# Patient Record
Sex: Male | Born: 1970 | Race: White | Hispanic: No | Marital: Married | Smoking: Never smoker
Health system: Southern US, Community
[De-identification: ages and names within clinical notes are randomized; demographics above are authoritative.]

## PROBLEM LIST (undated history)

## (undated) DIAGNOSIS — R079 Chest pain, unspecified: Secondary | ICD-10-CM

## (undated) DIAGNOSIS — N200 Calculus of kidney: Secondary | ICD-10-CM

## (undated) HISTORY — DX: Chest pain, unspecified: R07.9

---

## 2002-03-14 ENCOUNTER — Emergency Department (HOSPITAL_COMMUNITY): Admission: EM | Admit: 2002-03-14 | Discharge: 2002-03-14 | Payer: Self-pay | Admitting: Emergency Medicine

## 2006-11-07 ENCOUNTER — Encounter: Admission: RE | Admit: 2006-11-07 | Discharge: 2006-11-07 | Payer: Self-pay | Admitting: Internal Medicine

## 2008-06-01 IMAGING — US US RENAL
1 series · 14 of 25 positions shown · non-contrast
Comparison: None.

CLINICAL DATA: 35-year-old with hematuria. 
 RENAL/URINARY TRACT ULTRASOUND:
TECHNIQUE: Complete ultrasound examination of the urinary tract was performed including evaluation of the kidneys, renal collecting systems, and urinary bladder.

[Series 1: unknown · 0.23mm/px · 14 of 34 slices shown]
[im 1/34]
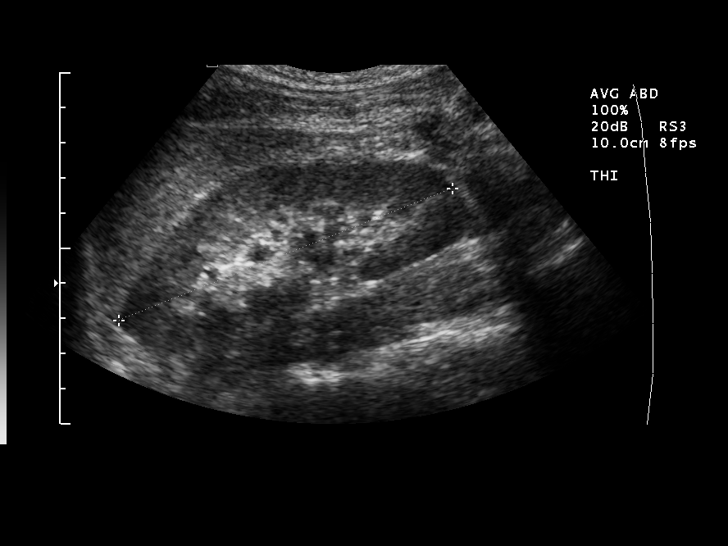
[im 3/34]
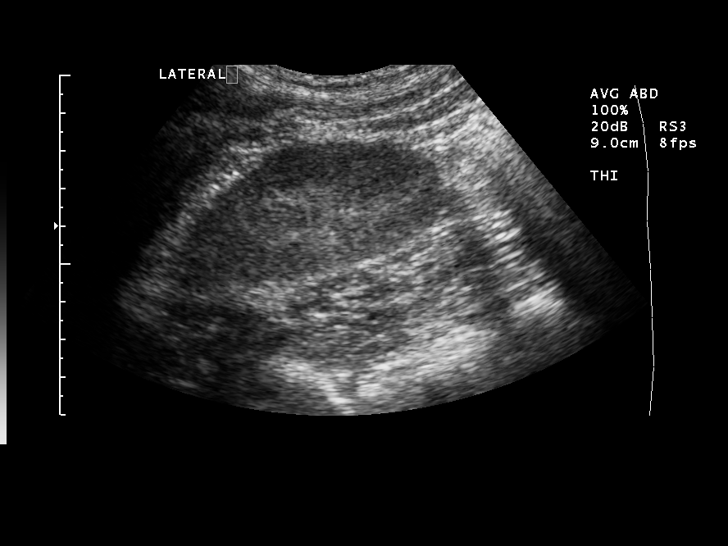
[im 6/34]
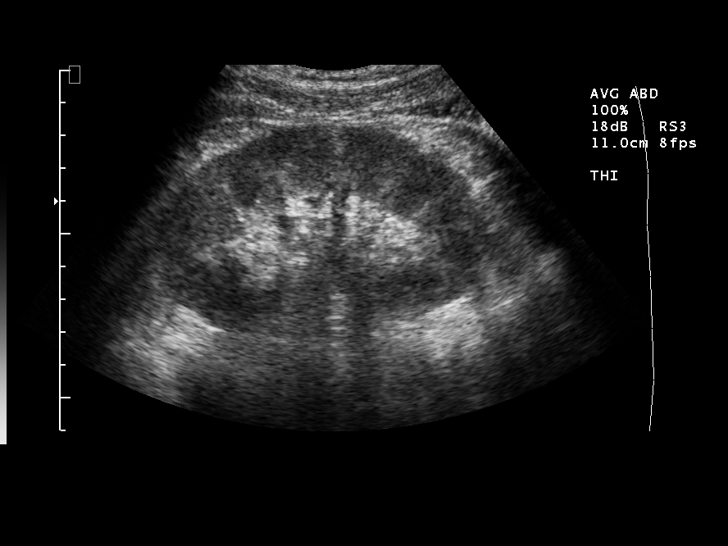
[im 9/34]
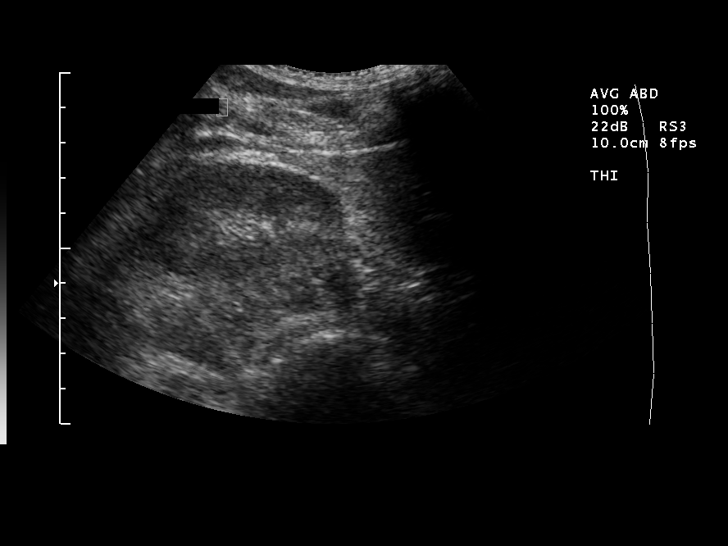
[im 12/34]
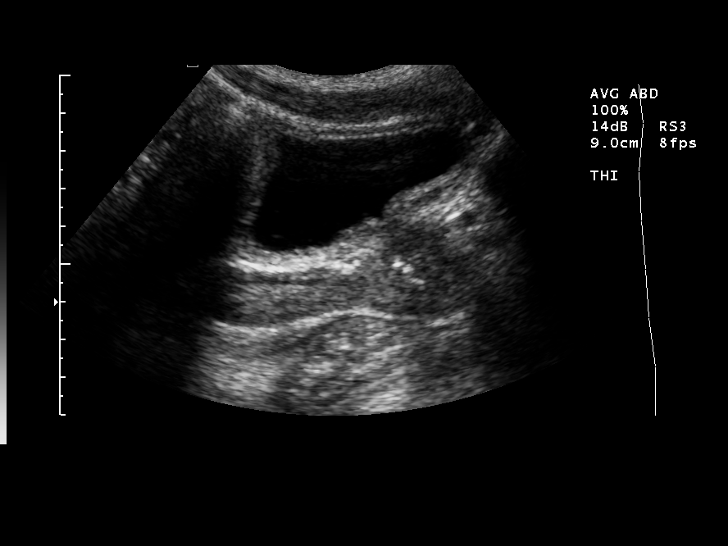
[im 13/34]
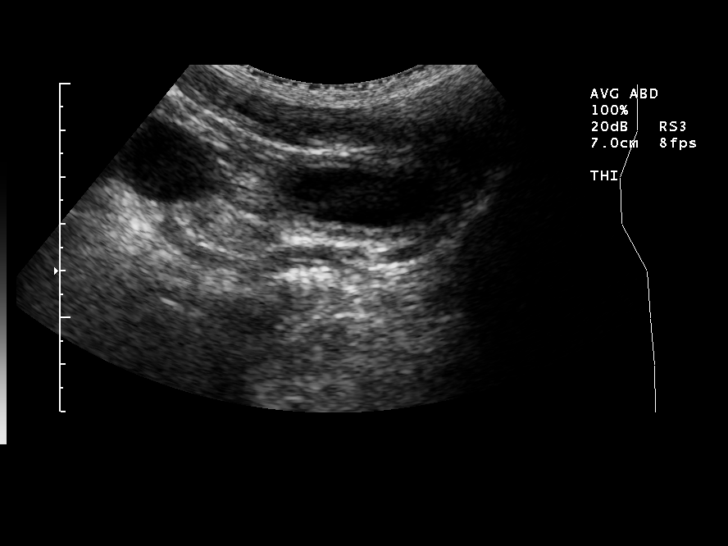
[im 16/34]
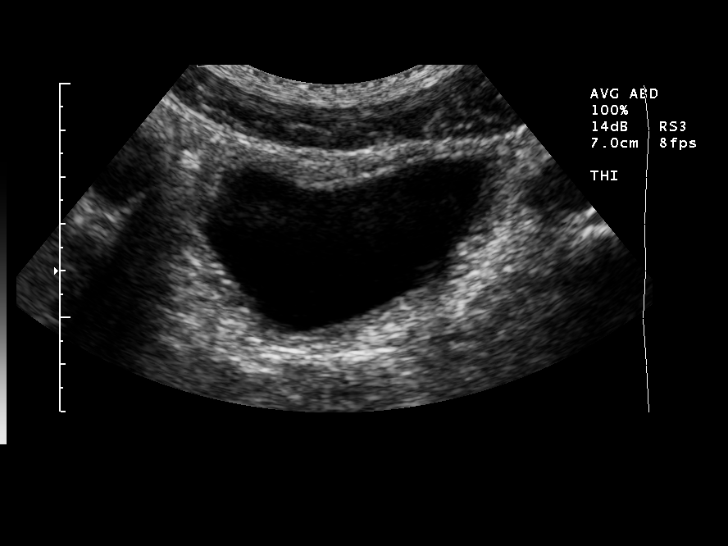
[im 18/34]
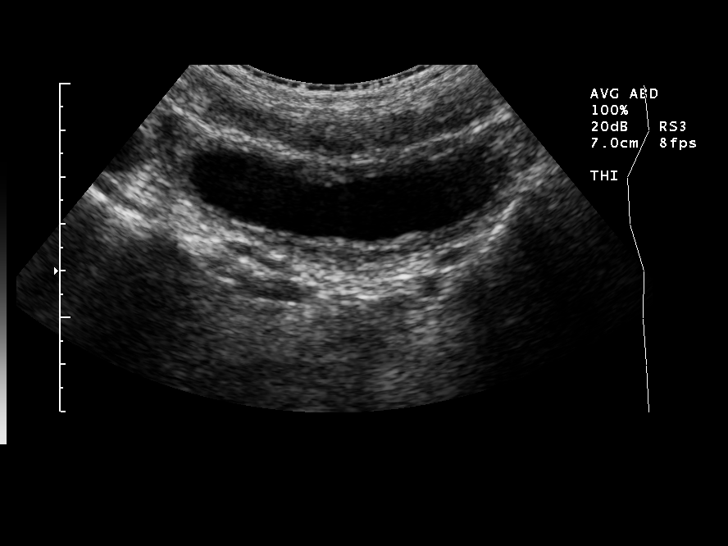
[im 21/34]
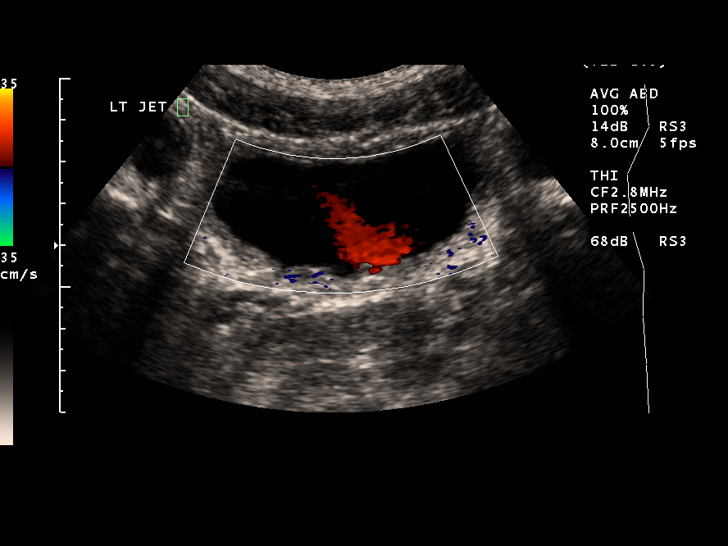
[im 23/34]
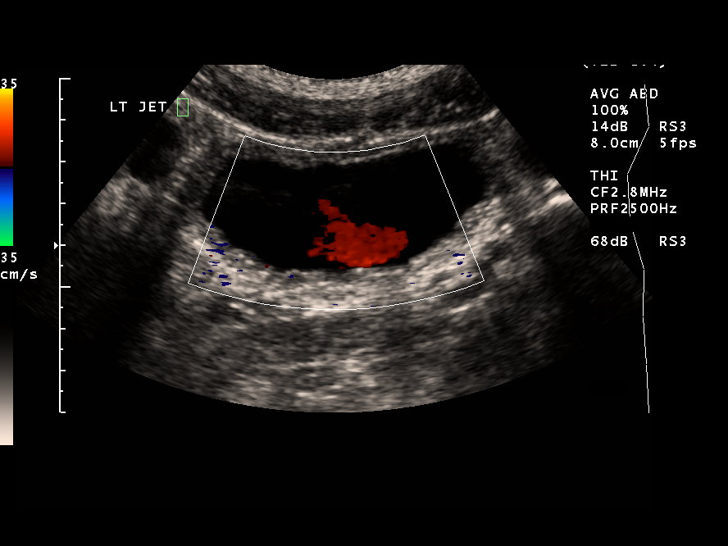
[im 25/34]
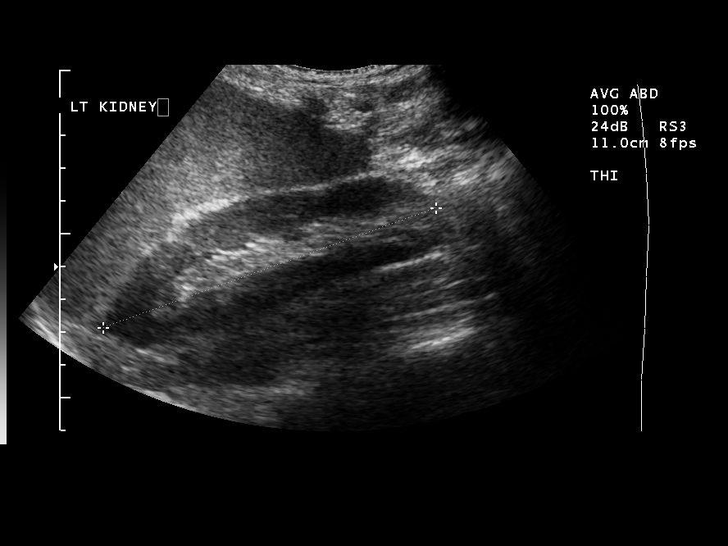
[im 28/34]
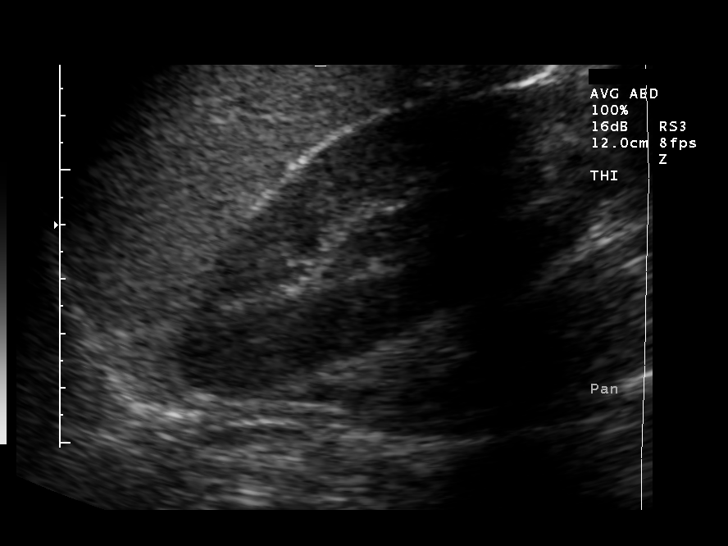
[im 31/34]
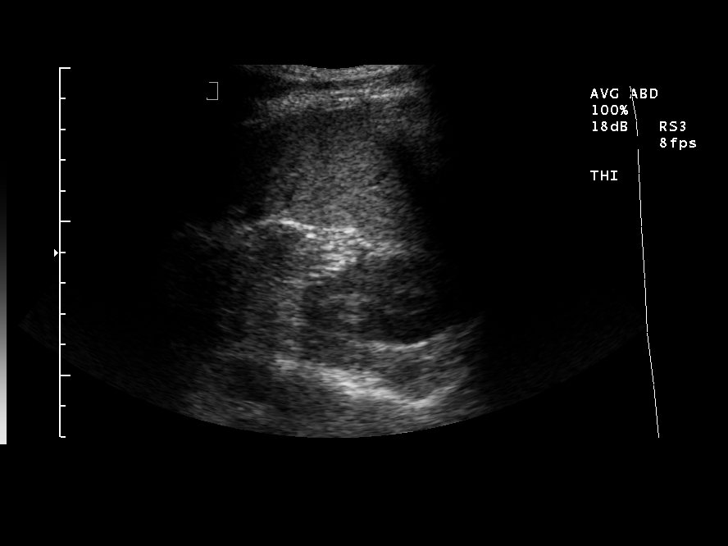
[im 34/34]
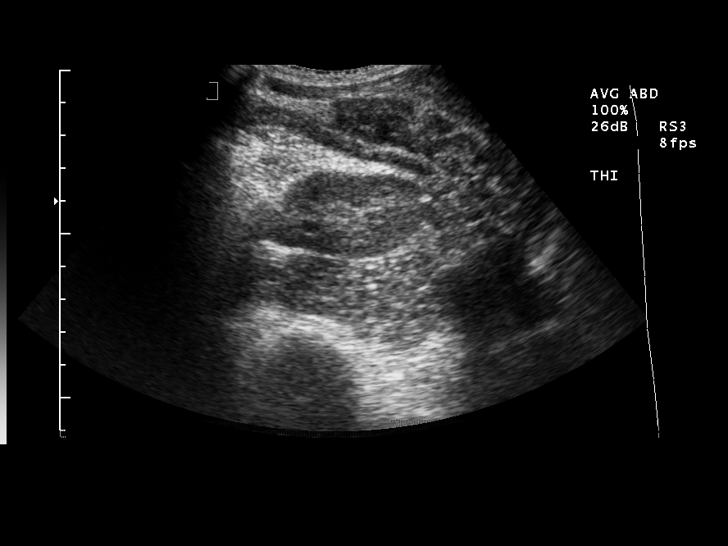

[14 of 25 positions shown; findings below may reference images not displayed]

FINDINGS: The right kidney measures 7.2 cm and the left kidney measures 7.8 cm.  Both kidneys demonstrate normal echogenicity and renal cortical thickness.  No hydronephrosis.  No definite renal calculi.  No perinephric fluid collections.  Bladder appears normal.
IMPRESSION: Normal renal ultrasound examination.

## 2022-12-11 ENCOUNTER — Emergency Department (HOSPITAL_COMMUNITY)
Admission: EM | Admit: 2022-12-11 | Discharge: 2022-12-12 | Disposition: A | Discharge status: Home / Self Care | Payer: BC Managed Care – PPO | Attending: Emergency Medicine | Admitting: Emergency Medicine

## 2022-12-11 ENCOUNTER — Other Ambulatory Visit: Payer: Self-pay

## 2022-12-11 ENCOUNTER — Emergency Department (HOSPITAL_COMMUNITY): Payer: BC Managed Care – PPO

## 2022-12-11 ENCOUNTER — Encounter (HOSPITAL_COMMUNITY): Payer: Self-pay

## 2022-12-11 DIAGNOSIS — R0602 Shortness of breath: Secondary | ICD-10-CM | POA: Diagnosis not present

## 2022-12-11 DIAGNOSIS — R000 Tachycardia, unspecified: Secondary | ICD-10-CM | POA: Diagnosis not present

## 2022-12-11 DIAGNOSIS — R072 Precordial pain: Secondary | ICD-10-CM | POA: Insufficient documentation

## 2022-12-11 DIAGNOSIS — R079 Chest pain, unspecified: Secondary | ICD-10-CM

## 2022-12-11 HISTORY — DX: Calculus of kidney: N20.0

## 2022-12-11 LAB — BASIC METABOLIC PANEL WITH GFR
Anion gap: 13 (ref 5–15)
BUN: 11 mg/dL (ref 6–20)
CO2: 24 mmol/L (ref 22–32)
Calcium: 9.7 mg/dL (ref 8.9–10.3)
Chloride: 102 mmol/L (ref 98–111)
Creatinine, Ser: 1.03 mg/dL (ref 0.61–1.24)
GFR, Estimated: >60 mL/min
Glucose, Bld: 110 mg/dL — ABNORMAL HIGH (ref 70–99)
Potassium: 3.1 mmol/L — ABNORMAL LOW (ref 3.5–5.1)
Sodium: 139 mmol/L (ref 135–145)

## 2022-12-11 LAB — CBC
HCT: 50 % (ref 39.0–52.0)
Hemoglobin: 16.8 g/dL (ref 13.0–17.0)
MCH: 29.8 pg (ref 26.0–34.0)
MCHC: 33.6 g/dL (ref 30.0–36.0)
MCV: 88.8 fL (ref 80.0–100.0)
Platelets: 306 K/uL (ref 150–400)
RBC: 5.63 MIL/uL (ref 4.22–5.81)
RDW: 12.4 % (ref 11.5–15.5)
WBC: 15.7 K/uL — ABNORMAL HIGH (ref 4.0–10.5)
nRBC: 0 % (ref 0.0–0.2)

## 2022-12-11 LAB — TROPONIN I (HIGH SENSITIVITY)
Troponin I (High Sensitivity): 5 ng/L (ref <18)
Troponin I (High Sensitivity): 8 ng/L

## 2022-12-11 NOTE — ED Provider Triage Note (Signed)
Emergency Medicine Provider Triage Evaluation Note  Gregory Tucker , a 52 y.o. male  was evaluated in triage.  Pt complains of substernal chest pain.  Began at 2:30 PM today while he was walking in a parking lot.  Does not radiate.  It has progressively gotten worse.  Described as a pressure.  It is not exertional.  Denies cardiac history.  States it is also painful to lean forward and take a deep breath.  Denies recent illnesses.  Denies cough or history of DVT/PE.  Review of Systems  Positive: As above Negative: As above  Physical Exam  BP (!) 146/99   Pulse 96   Temp 98.5 F (36.9 C) (Oral)   Resp 18   Ht 5\' 8"  (1.727 m)   Wt 63.5 kg   SpO2 97%   BMI 21.29 kg/m  Gen:   Awake, no distress   Resp:  Normal effort  MSK:   Moves extremities without difficulty  Other:    Medical Decision Making  Medically screening exam initiated at 7:44 PM.  Appropriate orders placed.  Gregory Tucker was informed that the remainder of the evaluation will be completed by another provider, this initial triage assessment does not replace that evaluation, and the importance of remaining in the ED until their evaluation is complete.  Workup initiated   Michelle Piper, Cordelia Poche 12/11/22 1944

## 2022-12-11 NOTE — ED Triage Notes (Addendum)
Pt c/o midsternal chest pain that radiates to left shoulder, SOB started at 1430 today. Pt c/o chills. Pt states it's painful with deep inspiration

## 2022-12-12 ENCOUNTER — Emergency Department (HOSPITAL_COMMUNITY): Payer: BC Managed Care – PPO

## 2022-12-12 MED ORDER — MORPHINE SULFATE (PF) 4 MG/ML IV SOLN
4.0000 mg | Freq: Once | INTRAVENOUS | Status: AC
  Administered 2022-12-12: 4 mg via INTRAVENOUS
  Filled 2022-12-12: qty 1

## 2022-12-12 MED ORDER — IOHEXOL 350 MG/ML SOLN
75.0000 mL | Freq: Once | INTRAVENOUS | Status: AC | PRN
  Administered 2022-12-12: 75 mL via INTRAVENOUS

## 2022-12-12 MED ORDER — HYDROCODONE-ACETAMINOPHEN 5-325 MG PO TABS: 1.0000 | ORAL_TABLET | ORAL | 0 refills | Status: AC | PRN

## 2022-12-12 NOTE — Discharge Instructions (Signed)
Cardiac work-up today was reassuring. Can take the norco as needed for severe pain. Please follow-up closely with your primary care doctor. I have also referred you to cardiology, they should be contacting you with an appt. Return here for any new/acute changes.

## 2022-12-12 NOTE — ED Provider Notes (Signed)
[place] EMERGENCY DEPARTMENT AT [facility] Provider Note   CSN: [id number] Arrival date & time: 12/11/22  1915     History  Chief Complaint  Patient presents with   Chest Pain   Shortness of Breath    Gregory Tucker is a 52 y.o. male.  The history is provided by the patient and the spouse.  Chest Pain Associated symptoms: shortness of breath   Shortness of Breath Associated symptoms: chest pain    52 year old male presenting to the ED with chest pain and shortness of breath.  States he noticed this around 2:30 PM today, seem to be rather sudden onset.  States pain has been persistent since onset but waxing and waning in severity.  Pain localized to mid-sternal region.  No abdominal pain, nausea, vomiting, or diarrhea.  He reports when taking a deep breath or leaning forward pain is more severe, better with leaning back or at rest.  Does not really seem worse with exertion.  He has no known cardiac history.  He denies any recent cough, fever, or other illness.  No nausea or vomiting.  No history of DVT or PE.  No recent travel, surgery, prolonged immobilization.  Home Medications Prior to Admission medications   Not on File      Allergies    Patient has no known allergies.    Review of Systems   Review of Systems  Respiratory:  Positive for shortness of breath.   Cardiovascular:  Positive for chest pain.  All other systems reviewed and are negative.   Physical Exam Updated Vital Signs BP (!) 133/108   Pulse (!) 109   Temp 98.2 F (36.8 C)   Resp (!) 21   Ht 5\' 8"  (1.727 m)   Wt 63.5 kg   SpO2 100%   BMI 21.29 kg/m   Physical Exam Vitals and nursing note reviewed.  Constitutional:      Appearance: He is well-developed.     Comments: Uncomfortable appearing on exam  HENT:     Head: Normocephalic and atraumatic.  Eyes:     Conjunctiva/sclera: Conjunctivae normal.     Pupils: Pupils are equal, round, and reactive to light.  Cardiovascular:      Rate and Rhythm: Regular rhythm. Tachycardia present.     Heart sounds: Normal heart sounds.     Comments: Tachy 107-110 during exam Pulmonary:     Effort: Pulmonary effort is normal.     Breath sounds: Normal breath sounds.  Abdominal:     General: Bowel sounds are normal.     Palpations: Abdomen is soft.     Tenderness: There is no abdominal tenderness. There is no guarding or rebound.     Comments: Soft, non-tender  Musculoskeletal:        General: Normal range of motion.     Cervical back: Normal range of motion.     Comments: No peripheral edema noted  Skin:    General: Skin is warm and dry.  Neurological:     Mental Status: He is alert and oriented to person, place, and time.     ED Results / Procedures / Treatments   Labs (all labs ordered are listed, but only abnormal results are displayed) Labs Reviewed  BASIC METABOLIC PANEL - Abnormal; Notable for the following components:      Result Value   Potassium 3.1 (*)    Glucose, Bld 110 (*)    All other components within normal limits  CBC - Abnormal; Notable  for the following components:   WBC 15.7 (*)    All other components within normal limits  TROPONIN I (HIGH SENSITIVITY)  TROPONIN I (HIGH SENSITIVITY)    EKG EKG Interpretation  Date/Time:  Wednesday December 12 2022 00:02:29 EDT Ventricular Rate:  107 PR Interval:  143 QRS Duration: 83 QT Interval:  308 QTC Calculation: 411 R Axis:   64 Text Interpretation: Sinus tachycardia Biatrial enlargement ST elev, probable normal early repol pattern Confirmed by Marily Memos [phone]) on 12/12/2022 3:37:22 AM  Radiology CT Angio Chest PE W and/or Wo Contrast  Result Date: 12/12/2022 CLINICAL DATA:  Pulmonary embolism (PE) suspected, low to intermediate prob, positive D-dimer EXAM: CT ANGIOGRAPHY CHEST WITH CONTRAST TECHNIQUE: Multidetector CT imaging of the chest was performed using the standard protocol during bolus administration of intravenous contrast. Multiplanar  CT image reconstructions and MIPs were obtained to evaluate the vascular anatomy. RADIATION DOSE REDUCTION: This exam was performed according to the departmental dose-optimization program which includes automated exposure control, adjustment of the mA and/or kV according to patient size and/or use of iterative reconstruction technique. CONTRAST:  70mL OMNIPAQUE IOHEXOL 350 MG/ML SOLN COMPARISON:  None Available. FINDINGS: Cardiovascular: No filling defects in the pulmonary arteries to suggest pulmonary emboli. Heart is normal size. Aorta is normal caliber. Mediastinum/Nodes: No mediastinal, hilar, or axillary adenopathy. Trachea and esophagus are unremarkable. Thyroid unremarkable. Lungs/Pleura: Dependent bibasilar atelectasis.  No effusions. Upper Abdomen: No acute findings Musculoskeletal: Chest wall soft tissues are unremarkable. No acute bony abnormality. Review of the MIP images confirms the above findings. IMPRESSION: No evidence of pulmonary embolus. Dependent and bibasilar atelectasis. Electronically Signed   By: Charlett Nose M.D.   On: 12/12/2022 03:37   DG Chest 2 View  Result Date: 12/11/2022 CLINICAL DATA:  Chest pain. EXAM: CHEST - 2 VIEW COMPARISON:  None Available. FINDINGS: The heart size and mediastinal contours are within normal limits. Both lungs are clear. The visualized skeletal structures are unremarkable. IMPRESSION: No active cardiopulmonary disease. Electronically Signed   By: Lupita Raider M.D.   On: 12/11/2022 20:23    Procedures Procedures    Medications Ordered in ED Medications  morphine (PF) 4 MG/ML injection 4 mg (4 mg Intravenous Given 12/12/22 0035)  iohexol (OMNIPAQUE) 350 MG/ML injection 75 mL (75 mLs Intravenous Contrast Given 12/12/22 0335)    ED Course/ Medical Decision Making/ A&P                             Medical Decision Making Amount and/or Complexity of Data Reviewed Labs: ordered. Radiology: ordered and independent interpretation  performed. ECG/medicine tests: ordered and independent interpretation performed.  Risk Prescription drug management.   52 year old male presenting to the ED with chest pain that began today around 2:30 PM.  He reports associated shortness of breath.  Pain does have a pleuritic component and is somewhat positional but not necessarily exertional.  His EKG is nonischemic.  Labs thus far reassuring with no profound anemia or electrolyte derangement.  Does have leukocytosis but no fever or other infectious symptoms. Troponin normal x 2.  Chest x-ray is clear.  He has no history of DVT or PE but some of his symptoms are concerning for such.  He remains tachycardic during my exam as well, no hypoxia.  Will obtain CTA chest to r/o PE.  Morphine given for pain.  CTA negative for acute findings-- no PE, normal esophagus, no findings in visualized upper abdomen.  Patient still with pain along mid-sternal region, unchanged really since time of arrival.  Remains without abdominal pain or vomiting.  Still has some intermittent tachycardia but BP stable.  Unclear etiology of his symptoms.  Will refer to cardiology for follow-up.  Also recommended close follow-up with PCP-- given copies of labs/imaging studies for physician review.  Strict return precautions for any new/acute changes.  Final Clinical Impression(s) / ED Diagnoses Final diagnoses:  Chest pain in adult    Rx / DC Orders ED Discharge Orders          Ordered    HYDROcodone-acetaminophen (NORCO/VICODIN) 5-325 MG tablet  Every 4 hours PRN        12/12/22 0421    Ambulatory referral to Cardiology        12/12/22 0421              Garlon HatchetSanders, Michon Kaczmarek M, PA-C 12/12/22 0426    Mesner, Barbara CowerJason, MD 12/12/22 [phone]

## 2023-01-21 ENCOUNTER — Ambulatory Visit: Payer: BC Managed Care – PPO | Admitting: Cardiovascular Disease
